# Patient Record
Sex: Male | Born: 1970 | Race: White | Hispanic: No | Marital: Married | Smoking: Never smoker
Health system: Southern US, Community
[De-identification: ages and names within clinical notes are randomized; demographics above are authoritative.]

## PROBLEM LIST (undated history)

## (undated) DIAGNOSIS — E039 Hypothyroidism, unspecified: Secondary | ICD-10-CM

## (undated) DIAGNOSIS — F411 Generalized anxiety disorder: Secondary | ICD-10-CM

## (undated) DIAGNOSIS — R1013 Epigastric pain: Secondary | ICD-10-CM

## (undated) DIAGNOSIS — N201 Calculus of ureter: Secondary | ICD-10-CM

## (undated) DIAGNOSIS — Q453 Other congenital malformations of pancreas and pancreatic duct: Secondary | ICD-10-CM

## (undated) DIAGNOSIS — K1379 Other lesions of oral mucosa: Secondary | ICD-10-CM

## (undated) DIAGNOSIS — D1803 Hemangioma of intra-abdominal structures: Secondary | ICD-10-CM

## (undated) DIAGNOSIS — N281 Cyst of kidney, acquired: Secondary | ICD-10-CM

## (undated) DIAGNOSIS — K760 Fatty (change of) liver, not elsewhere classified: Secondary | ICD-10-CM

## (undated) DIAGNOSIS — R740 Nonspecific elevation of levels of transaminase and lactic acid dehydrogenase [LDH]: Secondary | ICD-10-CM

## (undated) DIAGNOSIS — R7301 Impaired fasting glucose: Secondary | ICD-10-CM

## (undated) DIAGNOSIS — E038 Other specified hypothyroidism: Secondary | ICD-10-CM

## (undated) DIAGNOSIS — E781 Pure hyperglyceridemia: Secondary | ICD-10-CM

## (undated) DIAGNOSIS — N2889 Other specified disorders of kidney and ureter: Secondary | ICD-10-CM

## (undated) HISTORY — DX: Cyst of kidney, acquired: N28.1

## (undated) HISTORY — DX: Hemangioma of intra-abdominal structures: D18.03

## (undated) HISTORY — DX: Other congenital malformations of pancreas and pancreatic duct: Q45.3

## (undated) HISTORY — DX: Impaired fasting glucose: R73.01

## (undated) HISTORY — DX: Other specified disorders of kidney and ureter: N28.89

## (undated) HISTORY — DX: Hypothyroidism, unspecified: E03.9

## (undated) HISTORY — PX: ESOPHAGOGASTRODUODENOSCOPY: SHX1529

## (undated) HISTORY — DX: Other specified hypothyroidism: E03.8

## (undated) HISTORY — DX: Generalized anxiety disorder: F41.1

## (undated) HISTORY — DX: Fatty (change of) liver, not elsewhere classified: K76.0

## (undated) HISTORY — DX: Pure hyperglyceridemia: E78.1

## (undated) HISTORY — DX: Nonspecific elevation of levels of transaminase and lactic acid dehydrogenase (ldh): R74.0

## (undated) HISTORY — DX: Calculus of ureter: N20.1

## (undated) HISTORY — DX: Epigastric pain: R10.13

## (undated) HISTORY — DX: Other lesions of oral mucosa: K13.79

---

## 2008-11-03 ENCOUNTER — Encounter: Admission: RE | Admit: 2008-11-03 | Discharge: 2008-11-03 | Payer: Self-pay

## 2008-12-19 ENCOUNTER — Inpatient Hospital Stay (HOSPITAL_COMMUNITY): Admission: RE | Admit: 2008-12-19 | Discharge: 2008-12-20 | Payer: Self-pay | Admitting: Orthopedic Surgery

## 2009-02-23 ENCOUNTER — Inpatient Hospital Stay (HOSPITAL_COMMUNITY): Admission: RE | Admit: 2009-02-23 | Discharge: 2009-02-25 | Payer: Self-pay | Admitting: Orthopedic Surgery

## 2009-02-23 HISTORY — PX: TOTAL HIP ARTHROPLASTY: SHX124

## 2010-05-13 LAB — BASIC METABOLIC PANEL
BUN: 6 mg/dL (ref 6–23)
CO2: 27 mEq/L (ref 19–32)
Chloride: 101 mEq/L (ref 96–112)
GFR calc Af Amer: >60 mL/min (ref >60)
GFR calc non Af Amer: >60 mL/min (ref >60)
Glucose, Bld: 112 mg/dL — ABNORMAL HIGH (ref 70–99)
Sodium: 134 mEq/L — ABNORMAL LOW (ref 135–145)

## 2010-05-13 LAB — CBC
HCT: 33.7 % — ABNORMAL LOW (ref 39.0–52.0)
MCV: 96.4 fL (ref 78.0–100.0)
Platelets: 213 10*3/uL (ref 150–400)

## 2010-05-28 LAB — CBC
HCT: 42.8 % (ref 39.0–52.0)
Hemoglobin: 14.6 g/dL (ref 13.0–17.0)
MCHC: 35 g/dL (ref 30.0–36.0)
MCV: 96.1 fL (ref 78.0–100.0)
MCV: 96.7 fL (ref 78.0–100.0)
Platelets: 234 10*3/uL (ref 150–400)
Platelets: 252 10*3/uL (ref 150–400)
RBC: 4.45 MIL/uL (ref 4.22–5.81)
RDW: 12.5 % (ref 11.5–15.5)

## 2010-05-28 LAB — URINALYSIS, ROUTINE W REFLEX MICROSCOPIC
Bilirubin Urine: NEGATIVE
Glucose, UA: NEGATIVE mg/dL
Hgb urine dipstick: NEGATIVE
Nitrite: NEGATIVE
pH: 5 (ref 5.0–8.0)

## 2010-05-28 LAB — DIFFERENTIAL
Basophils Absolute: 0 10*3/uL (ref 0.0–0.1)
Basophils Relative: 1 % (ref 0–1)
Eosinophils Relative: 4 % (ref 0–5)
Lymphocytes Relative: 31 % (ref 12–46)
Monocytes Absolute: 0.3 10*3/uL (ref 0.1–1.0)
Neutrophils Relative %: 58 % (ref 43–77)

## 2010-05-28 LAB — BASIC METABOLIC PANEL
BUN: 7 mg/dL (ref 6–23)
CO2: 30 mEq/L (ref 19–32)
Chloride: 102 mEq/L (ref 96–112)
Creatinine, Ser: 0.82 mg/dL (ref 0.4–1.5)
GFR calc Af Amer: >60 mL/min (ref >60)
GFR calc non Af Amer: >60 mL/min (ref >60)
Glucose, Bld: 114 mg/dL — ABNORMAL HIGH (ref 70–99)
Sodium: 133 mEq/L — ABNORMAL LOW (ref 135–145)

## 2010-05-28 LAB — PROTIME-INR
INR: 0.85 (ref 0.00–1.49)
Prothrombin Time: 11.5 seconds — ABNORMAL LOW (ref 11.6–15.2)

## 2010-05-28 LAB — APTT: aPTT: 22 seconds — ABNORMAL LOW (ref 24–37)

## 2010-05-31 LAB — CBC
HCT: 44.1 % (ref 39.0–52.0)
Hemoglobin: 13.4 g/dL (ref 13.0–17.0)
Hemoglobin: 15.3 g/dL (ref 13.0–17.0)
MCHC: 34.5 g/dL (ref 30.0–36.0)
MCV: 95.4 fL (ref 78.0–100.0)
Platelets: 258 10*3/uL (ref 150–400)
RBC: 4.09 MIL/uL — ABNORMAL LOW (ref 4.22–5.81)
RBC: 4.62 MIL/uL (ref 4.22–5.81)
RDW: 13.2 % (ref 11.5–15.5)
WBC: 5.8 10*3/uL (ref 4.0–10.5)

## 2010-05-31 LAB — BASIC METABOLIC PANEL
BUN: 11 mg/dL (ref 6–23)
CO2: 30 mEq/L (ref 19–32)
Calcium: 8.8 mg/dL (ref 8.4–10.5)
Chloride: 102 mEq/L (ref 96–112)
Creatinine, Ser: 0.95 mg/dL (ref 0.4–1.5)
Glucose, Bld: 178 mg/dL — ABNORMAL HIGH (ref 70–99)
Sodium: 139 mEq/L (ref 135–145)

## 2010-05-31 LAB — TYPE AND SCREEN: ABO/RH(D): O POS

## 2010-05-31 LAB — GLUCOSE, CAPILLARY

## 2010-05-31 LAB — URINALYSIS, ROUTINE W REFLEX MICROSCOPIC
Hgb urine dipstick: NEGATIVE
Leukocytes, UA: NEGATIVE
Protein, ur: NEGATIVE mg/dL
Specific Gravity, Urine: 1.028 (ref 1.005–1.030)

## 2010-05-31 LAB — ABO/RH: ABO/RH(D): O POS

## 2010-05-31 LAB — PROTIME-INR
INR: 0.88 (ref 0.00–1.49)
Prothrombin Time: 11.9 seconds (ref 11.6–15.2)

## 2010-05-31 LAB — URINE MICROSCOPIC-ADD ON

## 2010-05-31 LAB — DIFFERENTIAL
Eosinophils Absolute: 0.1 10*3/uL (ref 0.0–0.7)
Eosinophils Relative: 1 % (ref 0–5)
Monocytes Absolute: 0.4 10*3/uL (ref 0.1–1.0)
Neutrophils Relative %: 56 % (ref 43–77)

## 2011-01-01 DIAGNOSIS — F32A Depression, unspecified: Secondary | ICD-10-CM | POA: Insufficient documentation

## 2011-01-01 DIAGNOSIS — F329 Major depressive disorder, single episode, unspecified: Secondary | ICD-10-CM | POA: Insufficient documentation

## 2011-07-16 DIAGNOSIS — F419 Anxiety disorder, unspecified: Secondary | ICD-10-CM | POA: Insufficient documentation

## 2012-10-26 DIAGNOSIS — N201 Calculus of ureter: Secondary | ICD-10-CM

## 2012-10-26 HISTORY — DX: Calculus of ureter: N20.1

## 2012-11-21 ENCOUNTER — Emergency Department (HOSPITAL_COMMUNITY): Payer: BC Managed Care – PPO

## 2012-11-21 ENCOUNTER — Encounter (HOSPITAL_COMMUNITY): Payer: Self-pay | Admitting: *Deleted

## 2012-11-21 ENCOUNTER — Emergency Department (HOSPITAL_COMMUNITY)
Admission: EM | Admit: 2012-11-21 | Discharge: 2012-11-21 | Disposition: A | Discharge status: Home / Self Care | Payer: BC Managed Care – PPO | Attending: Emergency Medicine | Admitting: Emergency Medicine

## 2012-11-21 DIAGNOSIS — Z79899 Other long term (current) drug therapy: Secondary | ICD-10-CM | POA: Insufficient documentation

## 2012-11-21 DIAGNOSIS — R112 Nausea with vomiting, unspecified: Secondary | ICD-10-CM | POA: Insufficient documentation

## 2012-11-21 DIAGNOSIS — R109 Unspecified abdominal pain: Secondary | ICD-10-CM | POA: Insufficient documentation

## 2012-11-21 DIAGNOSIS — M545 Low back pain, unspecified: Secondary | ICD-10-CM | POA: Insufficient documentation

## 2012-11-21 DIAGNOSIS — R6883 Chills (without fever): Secondary | ICD-10-CM | POA: Insufficient documentation

## 2012-11-21 DIAGNOSIS — N201 Calculus of ureter: Secondary | ICD-10-CM

## 2012-11-21 LAB — BASIC METABOLIC PANEL
BUN: 19 mg/dL (ref 6–23)
CO2: 23 mEq/L (ref 19–32)
Calcium: 9.5 mg/dL (ref 8.4–10.5)
GFR calc Af Amer: >90 mL/min (ref >90)
GFR calc non Af Amer: 87 mL/min — ABNORMAL LOW (ref >90)
Glucose, Bld: 158 mg/dL — ABNORMAL HIGH (ref 70–99)
Potassium: 3.8 mEq/L (ref 3.5–5.1)

## 2012-11-21 LAB — CBC WITH DIFFERENTIAL/PLATELET
Eosinophils Absolute: 0 10*3/uL (ref 0.0–0.7)
Eosinophils Relative: 0 % (ref 0–5)
HCT: 43.1 % (ref 39.0–52.0)
Hemoglobin: 15 g/dL (ref 13.0–17.0)
Lymphocytes Relative: 12 % (ref 12–46)
Lymphs Abs: 1.3 10*3/uL (ref 0.7–4.0)
MCH: 32.4 pg (ref 26.0–34.0)
MCHC: 34.8 g/dL (ref 30.0–36.0)
MCV: 93.1 fL (ref 78.0–100.0)
Monocytes Relative: 7 % (ref 3–12)
Neutrophils Relative %: 81 % — ABNORMAL HIGH (ref 43–77)
RBC: 4.63 MIL/uL (ref 4.22–5.81)
WBC: 10.7 10*3/uL — ABNORMAL HIGH (ref 4.0–10.5)

## 2012-11-21 LAB — URINE MICROSCOPIC-ADD ON

## 2012-11-21 LAB — URINALYSIS, ROUTINE W REFLEX MICROSCOPIC
Bilirubin Urine: NEGATIVE
Nitrite: NEGATIVE
Specific Gravity, Urine: 1.015 (ref 1.005–1.030)
pH: 5.5 (ref 5.0–8.0)

## 2012-11-21 MED ORDER — ONDANSETRON HCL 4 MG PO TABS: 4.0000 mg | ORAL_TABLET | Freq: Four times a day (QID) | ORAL | Status: DC

## 2012-11-21 MED ORDER — ONDANSETRON HCL 4 MG/2ML IJ SOLN
4.0000 mg | Freq: Once | INTRAMUSCULAR | Status: AC
  Administered 2012-11-21: 4 mg via INTRAVENOUS
  Filled 2012-11-21: qty 2

## 2012-11-21 MED ORDER — TAMSULOSIN HCL 0.4 MG PO CAPS: 0.4000 mg | ORAL_CAPSULE | Freq: Every day | ORAL | Status: DC

## 2012-11-21 MED ORDER — METOCLOPRAMIDE HCL 5 MG/ML IJ SOLN
10.0000 mg | Freq: Once | INTRAMUSCULAR | Status: AC
  Administered 2012-11-21: 10 mg via INTRAVENOUS
  Filled 2012-11-21: qty 2

## 2012-11-21 MED ORDER — HYDROMORPHONE HCL PF 1 MG/ML IJ SOLN
1.0000 mg | Freq: Once | INTRAMUSCULAR | Status: AC
  Administered 2012-11-21: 1 mg via INTRAVENOUS
  Filled 2012-11-21: qty 1

## 2012-11-21 MED ORDER — FENTANYL CITRATE 0.05 MG/ML IJ SOLN
25.0000 ug | Freq: Once | INTRAMUSCULAR | Status: AC
  Administered 2012-11-21: 25 ug via INTRAVENOUS
  Filled 2012-11-21: qty 2

## 2012-11-21 MED ORDER — HYDROCODONE-ACETAMINOPHEN 5-325 MG PO TABS: 1.0000 | ORAL_TABLET | ORAL | Status: DC | PRN

## 2012-11-21 MED ORDER — DIAZEPAM 5 MG/ML IJ SOLN
5.0000 mg | Freq: Once | INTRAMUSCULAR | Status: AC
  Administered 2012-11-21: 5 mg via INTRAVENOUS
  Filled 2012-11-21: qty 2

## 2012-11-21 NOTE — ED Provider Notes (Signed)
CSN: [id number]     Arrival date & time 11/21/12  0508 History   First MD Initiated Contact with Patient 11/21/12 0559     Chief Complaint  Patient presents with  . Testicle Pain   (Consider location/radiation/quality/duration/timing/severity/associated sxs/prior Treatment) HPI Comments: Patient presents with acute onset left testicular pain that woke him from sleep at 0100 today.  States "I feel like I got kicked in the nuts." Pain radiates into abdomen and low back pain described as mild and dull, associated nausea and vomiting, and chills.  States he was feeling well last night when he went to bed.  Denies fever, testicular swelling, dysuria, urinary frequency or urgency.  No hx abdominal surgeries.  No hx kidney stones or testicular problems or hernia. Pain 10/10 intensity.   The history is provided by the patient.    History reviewed. No pertinent past medical history. History reviewed. No pertinent past surgical history. No family history on file. History  Substance Use Topics  . Smoking status: Never Smoker   . Smokeless tobacco: Not on file  . Alcohol Use: Yes    Review of Systems  Constitutional: Positive for chills. Negative for fever.  Gastrointestinal: Positive for nausea, vomiting and abdominal pain.  Genitourinary: Positive for testicular pain. Negative for dysuria, urgency, frequency, penile swelling, scrotal swelling and penile pain.  Musculoskeletal: Positive for back pain.    Allergies  Review of patient's allergies indicates no known allergies.  Home Medications   Current Outpatient Rx  Name  Route  Sig  Dispense  Refill  . fish oil-omega-3 fatty acids 1000 MG capsule   Oral   Take 2 g by mouth daily.         Marland Kitchen NIACIN PO   Oral   Take 1 tablet by mouth daily.         . sertraline (ZOLOFT) 100 MG tablet   Oral   Take 100 mg by mouth daily.           BP 172/102  Pulse 101  Temp(Src) 97.3 F (36.3 C) (Oral)  Resp 20  SpO2 100% Physical  Exam  Nursing note and vitals reviewed. Constitutional: He appears well-developed and well-nourished. No distress.  HENT:  Head: Normocephalic and atraumatic.  Neck: Neck supple.  Pulmonary/Chest: Effort normal.  Abdominal: Soft. He exhibits no distension. There is no tenderness. There is no rebound, no guarding and no CVA tenderness.  Genitourinary: Penis normal. Right testis shows no mass, no swelling and no tenderness. Left testis shows tenderness. Left testis shows no mass and no swelling. Circumcised.  No erythema, edema, warmth, or discoloration of scrotum.   Neurological: He is alert.  Skin: He is not diaphoretic.    ED Course  Procedures (including critical care time) Labs Review Labs Reviewed  URINALYSIS, ROUTINE W REFLEX MICROSCOPIC - Abnormal; Notable for the following:    Glucose, UA 100 (*)    Hgb urine dipstick TRACE (*)    All other components within normal limits  CBC WITH DIFFERENTIAL - Abnormal; Notable for the following:    WBC 10.7 (*)    Neutrophils Relative % 81 (*)    Neutro Abs 8.7 (*)    All other components within normal limits  BASIC METABOLIC PANEL - Abnormal; Notable for the following:    Glucose, Bld 158 (*)    GFR calc non Af Amer 87 (*)    All other components within normal limits  URINE MICROSCOPIC-ADD ON   Imaging Review Ct Abdomen Pelvis  Wo Contrast  11/21/2012   CLINICAL DATA:  Left groin pain. Nausea and vomiting. Urolithiasis.  EXAM: CT ABDOMEN AND PELVIS WITHOUT CONTRAST  TECHNIQUE: Multidetector CT imaging of the abdomen and pelvis was performed following the standard protocol without intravenous contrast.  COMPARISON:  None.  FINDINGS: Mild left hydronephrosis and ureterectasis seen as well as mild left perinephric stranding. There is a 2 mm calculus in the urinary bladder near the left ureterovesical junction, which may have already passed into the bladder. No other ureteral calculi identified. Several other tiny 1 mm nonobstructive calculi  noted in the right kidney. Right hip prosthesis causes beam hardening artifact in the inferior aspect of the bladder and pelvis.  The other abdominal parenchymal organs have a normal appearance on this noncontrast study. Gallbladder is unremarkable. No soft tissue masses or lymphadenopathy identified. No evidence of dilated bowel loops.  IMPRESSION: Mild left hydroureteronephrosis with perinephric stranding due to 2 mm calculus near the left ureterovesical junction, which may have already passed into the bladder.  Tiny nonobstructive right nephrolithiasis.   Electronically Signed   By: Myles Rosenthal   On: 11/21/2012 09:04   US Scrotum  11/21/2012   CLINICAL DATA:  Left testicular pain  EXAM: SCROTAL ULTRASOUND  DOPPLER ULTRASOUND OF THE TESTICLES  TECHNIQUE: Complete ultrasound examination of the testicles, epididymis, and other scrotal structures was performed. Color and spectral Doppler ultrasound were also utilized to evaluate blood flow to the testicles.  COMPARISON:  None.  FINDINGS: Right testis: Normal in size and appearance, measuring 4.0 x 2.4 x 2.7 cm. Normal color Doppler flow.  Left testis: Normal in size and appearance, measuring 4.6 x 2.4 x 2.7 cm. Normal color Doppler flow.  Right epididymis:  Normal in size and appearance.  Left epididymis:  Normal in size and appearance.  Hydrocele:  Small hydroceles bilaterally.  Varicocele:  Absent.  Pulsed Doppler interrogation of both testes demonstrates low resistance arterial and venous waveforms bilaterally.  Additional comments: Mild scrotal wall thickening/ edema.  IMPRESSION: Normal sonographic appearance of the bilateral testes.  No evidence of testicular torsion.  Mild scrotal wall thickening/edema, nonspecific.   Electronically Signed   By: Charline Bills M.D.   On: 11/21/2012 08:21   [place] Art/ven Flow Abd Pelv Doppler  11/21/2012   CLINICAL DATA:  Left testicular pain  EXAM: SCROTAL ULTRASOUND  DOPPLER ULTRASOUND OF THE TESTICLES  TECHNIQUE:  Complete ultrasound examination of the testicles, epididymis, and other scrotal structures was performed. Color and spectral Doppler ultrasound were also utilized to evaluate blood flow to the testicles.  COMPARISON:  None.  FINDINGS: Right testis: Normal in size and appearance, measuring 4.0 x 2.4 x 2.7 cm. Normal color Doppler flow.  Left testis: Normal in size and appearance, measuring 4.6 x 2.4 x 2.7 cm. Normal color Doppler flow.  Right epididymis:  Normal in size and appearance.  Left epididymis:  Normal in size and appearance.  Hydrocele:  Small hydroceles bilaterally.  Varicocele:  Absent.  Pulsed Doppler interrogation of both testes demonstrates low resistance arterial and venous waveforms bilaterally.  Additional comments: Mild scrotal wall thickening/ edema.  IMPRESSION: Normal sonographic appearance of the bilateral testes.  No evidence of testicular torsion.  Mild scrotal wall thickening/edema, nonspecific.   Electronically Signed   By: Charline Bills M.D.   On: 11/21/2012 08:21    6:06 AM Dr Silverio Lay made aware of the patient. [place], UA, pain and nausea medications ordered.  Patient and spouse made aware of findings and plans for  workup.  DDx includes testicular torsion, ureteral stone.  7:53 AM Multiple rechecks on patient for persistent pain, nausea, and dizziness after pain medication.  Pain is still 10/10.  Will given valium for dizziness and nausea, pain as pain is untouched by narcotics.  Pt has been to ultrasound and returned, awaiting results.    MDM   1. Ureteral stone    Patient with left testicular pain, acute onset, found to have left ureteral stone 2mm.  Pt r/o for testicular torsion with [place].  Pain and nausea treated in ED. D/C home with pain, nausea meds and flomax, urology follow up.  Discussed results, findings, treatment, and follow up  with patient.  Pt given return precautions.  Pt verbalizes understanding and agrees with plan.        Trixie Dredge, PA-C 11/21/12 1018

## 2012-11-21 NOTE — ED Notes (Signed)
Patient transported to Ultrasound 

## 2012-11-21 NOTE — ED Notes (Signed)
Testicle pain sudden onset 0100 nauseated.  No difficulty voiding

## 2012-11-21 NOTE — ED Provider Notes (Signed)
Medical screening examination/treatment/procedure(s) were performed by non-physician practitioner and as supervising physician I was immediately available for consultation/collaboration.   Richardean Canal, MD 11/21/12 2251

## 2012-11-21 NOTE — ED Notes (Signed)
Pt states he woke up at 0100 this morning from sleeping with severe left testicular pain, pt states he also has lower abdominal pain.

## 2012-11-21 NOTE — ED Notes (Signed)
Patient transported to CT 

## 2012-11-21 NOTE — ED Notes (Signed)
Returned from U/S

## 2015-05-25 ENCOUNTER — Ambulatory Visit: Payer: Self-pay | Admitting: Family Medicine

## 2015-05-26 ENCOUNTER — Ambulatory Visit: Payer: Self-pay | Admitting: Family Medicine

## 2015-06-02 ENCOUNTER — Ambulatory Visit: Payer: Self-pay | Admitting: Family Medicine

## 2015-06-02 ENCOUNTER — Encounter: Payer: Self-pay | Admitting: Family Medicine

## 2015-06-02 DIAGNOSIS — Z0289 Encounter for other administrative examinations: Secondary | ICD-10-CM

## 2015-06-26 DIAGNOSIS — R7401 Elevation of levels of liver transaminase levels: Secondary | ICD-10-CM | POA: Insufficient documentation

## 2015-06-26 DIAGNOSIS — R740 Nonspecific elevation of levels of transaminase and lactic acid dehydrogenase [LDH]: Secondary | ICD-10-CM

## 2015-06-26 HISTORY — DX: Elevation of levels of liver transaminase levels: R74.01

## 2015-06-30 ENCOUNTER — Ambulatory Visit (INDEPENDENT_AMBULATORY_CARE_PROVIDER_SITE_OTHER): Payer: BLUE CROSS/BLUE SHIELD | Admitting: Family Medicine

## 2015-06-30 ENCOUNTER — Encounter: Payer: Self-pay | Admitting: Family Medicine

## 2015-06-30 VITALS — BP 144/91 | HR 87 | Temp 98.5°F | Ht 69.0 in | Wt 186.1 lb

## 2015-06-30 DIAGNOSIS — Z0000 Encounter for general adult medical examination without abnormal findings: Secondary | ICD-10-CM | POA: Diagnosis not present

## 2015-06-30 MED ORDER — SERTRALINE HCL 100 MG PO TABS: 100.0000 mg | ORAL_TABLET | Freq: Every day | ORAL | Status: DC

## 2015-06-30 NOTE — Progress Notes (Signed)
Office Note 07/02/2015  CC:  Chief Complaint  Patient presents with  . New Patient (Initial Visit)    establish care    HPI:  Andrew Guzman is a 45 y.o. White male who is here to establish care. Patient's most recent primary MD: [facility] in [place]. Old records from EMR were reviewed prior to or during today's visit.  Most recent CPE with labs was approx 2014 prior to him getting his kidney stone. No acute complaints.  Past Medical History  Diagnosis Date  . Ureterolithiasis 10/2012  . Recurrent oral ulcers   . 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 GAD (generalized anxiety disorder)     zoloft helpful, esp for recurrent epigastric pains  . Dyspepsia     Past Surgical History  Procedure Laterality Date  . Total hip arthroplasty Right 02/23/2009    for avascular necrosis (Dr. Alvan Dame)  . Esophagogastroduodenoscopy      dyspepsia/?PUD    Family History  Problem Relation Age of Onset  . Heart Problems Father   . Heart attack Paternal Grandfather     Social History   Social History  . Marital Status: Married    Spouse Name: Amy  . Number of Children: 4  . Years of Education: Assoc   Occupational History  .  Other    [employer]   Social History Main Topics  . Smoking status: Never Smoker   . Smokeless tobacco: Never Used  . Alcohol Use: 0.0 oz/week    0 Standard drinks or equivalent per week     Comment: 2 beers a night  . Drug Use: No  . Sexual Activity: Not on file   Other Topics Concern  . Not on file   Social History Narrative   Married, 4 kids, one lives with him.   Educ: Associates degree   OccupTheatre manager for an Geneticist, molecular   No T/A/D's.   Caffeine:1-2 cups daily    Outpatient Encounter Prescriptions as of 06/30/2015  Medication Sig  . sertraline (ZOLOFT) 100 MG tablet Take 1 tablet (100 mg total) by mouth daily.  . [DISCONTINUED] sertraline (ZOLOFT) 100 MG tablet Take 100 mg by mouth daily.   .  [DISCONTINUED] fish oil-omega-3 fatty acids 1000 MG capsule Take 2 g by mouth daily.  . [DISCONTINUED] HYDROcodone-acetaminophen (NORCO/VICODIN) 5-325 MG per tablet Take 1-2 tablets by mouth every 4 (four) hours as needed for pain.  . [DISCONTINUED] NIACIN PO Take 1 tablet by mouth daily.  . [DISCONTINUED] ondansetron (ZOFRAN) 4 MG tablet Take 1 tablet (4 mg total) by mouth every 6 (six) hours.  . [DISCONTINUED] tamsulosin (FLOMAX) 0.4 MG CAPS capsule Take 1 capsule (0.4 mg total) by mouth daily.   No facility-administered encounter medications on file as of 06/30/2015.    No Known Allergies  ROS Review of Systems  Constitutional: Negative for fever, chills, appetite change and fatigue.  HENT: Negative for congestion, dental problem, ear pain and sore throat.   Eyes: Negative for discharge, redness and visual disturbance.  Respiratory: Negative for cough, chest tightness, shortness of breath and wheezing.   Cardiovascular: Negative for chest pain, palpitations and leg swelling.  Gastrointestinal: Negative for nausea, vomiting, abdominal pain, diarrhea and blood in stool.  Genitourinary: Negative for dysuria, urgency, frequency, hematuria, flank pain and difficulty urinating.  Musculoskeletal: Negative for myalgias, back pain, joint swelling, arthralgias and neck stiffness.  Skin: Negative for pallor and rash.  Neurological: Negative for dizziness, speech  difficulty, weakness and headaches.  Hematological: Negative for adenopathy. Does not bruise/bleed easily.  Psychiatric/Behavioral: Negative for confusion and sleep disturbance. The patient is not nervous/anxious.     PE; Blood pressure 144/91, pulse 87, temperature 98.5 F (36.9 C), temperature source Oral, height 5\' 9"  (1.753 m), weight 186 lb 1.9 oz (84.423 kg), SpO2 96 %. Gen: Alert, well appearing.  Patient is oriented to person, place, time, and situation. AFFECT: pleasant, lucid thought and speech. ENT: Ears: EACs clear, normal  epithelium.  TMs with good light reflex and landmarks bilaterally.  Eyes: no injection, icteris, swelling, or exudate.  EOMI, PERRLA. Nose: no drainage or turbinate edema/swelling.  No injection or focal lesion.  Mouth: lips without lesion/swelling.  Oral mucosa pink and moist.  Dentition intact and without obvious caries or gingival swelling.  Oropharynx without erythema, exudate, or swelling.  Neck: supple/nontender.  No LAD, mass, or TM.  Carotid pulses 2+ bilaterally, without bruits. CV: RRR, no m/r/g.   LUNGS: CTA bilat, nonlabored resps, good aeration in all lung fields. ABD: soft, NT, ND, BS normal.  No hepatospenomegaly or mass.  No bruits. EXT: no clubbing, cyanosis, or edema.  Musculoskeletal: no joint swelling, erythema, warmth, or tenderness.  ROM of all joints intact. Skin - no sores or suspicious lesions or rashes or color changes   Pertinent labs:  none  ASSESSMENT AND PLAN:   New pt.  Health maintenance exam: Reviewed age and gender appropriate health maintenance issues (prudent diet, regular exercise, health risks of tobacco and excessive alcohol, use of seatbelts, fire alarms in home, use of sunscreen).  Also reviewed age and gender appropriate health screening as well as vaccine recommendations. No Vaccines needed (pt reports last Tdap was 2014). He'll return to lab when fasting to get HP labs drawn. I refilled his sertraline today.  An After Visit Summary was printed and given to the patient.  Return in about 1 year (around 06/29/2016) for annual CPE (fasting).  Signed:  Crissie Sickles, MD           07/02/2015

## 2015-06-30 NOTE — Progress Notes (Signed)
Pre visit review using our clinic review tool, if applicable. No additional management support is needed unless otherwise documented below in the visit note. 

## 2015-07-02 ENCOUNTER — Encounter: Payer: Self-pay | Admitting: Family Medicine

## 2015-07-03 ENCOUNTER — Other Ambulatory Visit (INDEPENDENT_AMBULATORY_CARE_PROVIDER_SITE_OTHER): Payer: BLUE CROSS/BLUE SHIELD

## 2015-07-03 DIAGNOSIS — R7989 Other specified abnormal findings of blood chemistry: Secondary | ICD-10-CM | POA: Diagnosis not present

## 2015-07-03 DIAGNOSIS — Z0000 Encounter for general adult medical examination without abnormal findings: Secondary | ICD-10-CM | POA: Diagnosis not present

## 2015-07-03 DIAGNOSIS — R946 Abnormal results of thyroid function studies: Secondary | ICD-10-CM

## 2015-07-03 LAB — COMPREHENSIVE METABOLIC PANEL
ALBUMIN: 4.2 g/dL (ref 3.5–5.2)
ALT: 76 U/L — ABNORMAL HIGH (ref 0–53)
AST: 98 U/L — ABNORMAL HIGH (ref 0–37)
Alkaline Phosphatase: 125 U/L — ABNORMAL HIGH (ref 39–117)
BILIRUBIN TOTAL: 0.9 mg/dL (ref 0.2–1.2)
BUN: 12 mg/dL (ref 6–23)
CO2: 26 mEq/L (ref 19–32)
Calcium: 9.3 mg/dL (ref 8.4–10.5)
Chloride: 99 mEq/L (ref 96–112)
Creatinine, Ser: 0.84 mg/dL (ref 0.40–1.50)
GFR: 104.46 mL/min (ref >60.00)
GLUCOSE: 124 mg/dL — AB (ref 70–99)
Potassium: 3.7 mEq/L (ref 3.5–5.1)
SODIUM: 137 meq/L (ref 135–145)
TOTAL PROTEIN: 7.3 g/dL (ref 6.0–8.3)

## 2015-07-03 LAB — LIPID PANEL
CHOL/HDL RATIO: 4
Cholesterol: 265 mg/dL — ABNORMAL HIGH (ref 0–200)
HDL: 59.3 mg/dL (ref >39.00)
Triglycerides: 801 mg/dL — ABNORMAL HIGH (ref 0.0–149.0)

## 2015-07-03 LAB — CBC WITH DIFFERENTIAL/PLATELET
BASOS PCT: 1 % (ref 0.0–3.0)
Basophils Absolute: 0.1 10*3/uL (ref 0.0–0.1)
EOS PCT: 4.1 % (ref 0.0–5.0)
Eosinophils Absolute: 0.3 10*3/uL (ref 0.0–0.7)
HCT: 43.5 % (ref 39.0–52.0)
Hemoglobin: 14.9 g/dL (ref 13.0–17.0)
LYMPHS ABS: 2 10*3/uL (ref 0.7–4.0)
Lymphocytes Relative: 31.4 % (ref 12.0–46.0)
MCHC: 34.3 g/dL (ref 30.0–36.0)
MCV: 98.3 fl (ref 78.0–100.0)
MONOS PCT: 8 % (ref 3.0–12.0)
Monocytes Absolute: 0.5 10*3/uL (ref 0.1–1.0)
Neutro Abs: 3.5 10*3/uL (ref 1.4–7.7)
Neutrophils Relative %: 55.5 % (ref 43.0–77.0)
Platelets: 275 10*3/uL (ref 150.0–400.0)
RBC: 4.43 Mil/uL (ref 4.22–5.81)
RDW: 13.8 % (ref 11.5–15.5)
WBC: 6.3 10*3/uL (ref 4.0–10.5)

## 2015-07-03 LAB — T4, FREE: Free T4: 0.62 ng/dL (ref 0.60–1.60)

## 2015-07-03 LAB — TSH: TSH: 8.05 u[IU]/mL — ABNORMAL HIGH (ref 0.35–4.50)

## 2015-07-03 LAB — LDL CHOLESTEROL, DIRECT: Direct LDL: 104 mg/dL

## 2015-07-03 LAB — T3, FREE: T3, Free: 3.5 pg/mL (ref 2.3–4.2)

## 2015-07-07 ENCOUNTER — Other Ambulatory Visit: Payer: Self-pay | Admitting: Family Medicine

## 2015-07-07 DIAGNOSIS — R7301 Impaired fasting glucose: Secondary | ICD-10-CM

## 2015-07-07 DIAGNOSIS — R739 Hyperglycemia, unspecified: Secondary | ICD-10-CM

## 2015-07-07 MED ORDER — FENOFIBRATE 145 MG PO TABS: 145.0000 mg | ORAL_TABLET | Freq: Every day | ORAL | Status: DC

## 2015-08-09 ENCOUNTER — Ambulatory Visit (INDEPENDENT_AMBULATORY_CARE_PROVIDER_SITE_OTHER): Payer: BLUE CROSS/BLUE SHIELD | Admitting: Family Medicine

## 2015-08-09 ENCOUNTER — Ambulatory Visit (HOSPITAL_BASED_OUTPATIENT_CLINIC_OR_DEPARTMENT_OTHER)
Admission: RE | Admit: 2015-08-09 | Discharge: 2015-08-09 | Disposition: A | Discharge status: Home / Self Care | Payer: BLUE CROSS/BLUE SHIELD | Source: Ambulatory Visit | Attending: Family Medicine | Admitting: Family Medicine

## 2015-08-09 ENCOUNTER — Encounter: Payer: Self-pay | Admitting: Family Medicine

## 2015-08-09 VITALS — BP 143/93 | HR 101 | Temp 98.5°F | Resp 16 | Ht 69.0 in | Wt 180.2 lb

## 2015-08-09 DIAGNOSIS — M79605 Pain in left leg: Secondary | ICD-10-CM | POA: Diagnosis not present

## 2015-08-09 DIAGNOSIS — M79662 Pain in left lower leg: Secondary | ICD-10-CM

## 2015-08-09 DIAGNOSIS — R208 Other disturbances of skin sensation: Secondary | ICD-10-CM

## 2015-08-09 NOTE — Progress Notes (Signed)
OFFICE VISIT  08/09/2015   CC:  Chief Complaint  Patient presents with  . Numbness    left leg x 4 weeks     HPI:    Patient is a 45 y.o. Caucasian male who presents for about 3-4 weeks history of intermittent left leg discomfort that he finds hard to describe.  Location is usually most prominent in foot and lower leg up to knee, but he also feels some in upper leg up to L hip region.  He deals with this constantly over the last 2 weeks but some days are worse than others.  No similar sensation in R leg.  No injury to left leg.  No signif weakness.  No tingling.  No pain in back radiating down leg.  He feels like it is coming from his foot up.  He hesitates to use the words pain or numbness. Hx of THA on R for avascular necrosis.    Past Medical History  Diagnosis Date  . Ureterolithiasis 10/2012  . Recurrent oral ulcers   . 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 GAD (generalized anxiety disorder)     zoloft helpful, esp for recurrent epigastric pains  . Dyspepsia     Past Surgical History  Procedure Laterality Date  . Total hip arthroplasty Right 02/23/2009    for avascular necrosis (Dr. Alvan Dame)  . Esophagogastroduodenoscopy      dyspepsia/?PUD    Outpatient Prescriptions Prior to Visit  Medication Sig Dispense Refill  . fenofibrate (TRICOR) 145 MG tablet Take 1 tablet (145 mg total) by mouth daily. 30 tablet 2  . sertraline (ZOLOFT) 100 MG tablet Take 1 tablet (100 mg total) by mouth daily. 90 tablet 3   No facility-administered medications prior to visit.    No Known Allergies  ROS As per HPI  PE: Blood pressure 143/93, pulse 101, temperature 98.5 F (36.9 C), temperature source Oral, resp. rate 16, height 5\' 9"  (1.753 m), weight 180 lb 4 oz (81.761 kg), SpO2 94 %. Gen: Alert, well appearing.  Patient is oriented to person, place, time, and situation. LEGS: strength 5/5 prox/dist. Left hip, knee, and ankle ROM all fully intact w/out pain or  stiffness. No calf tenderness or cord palpable.  No LL edema or discoloration. DP and PT pulse 1+ bilat.   Sensation intact to light touch and pin prick bilat in legs prox and dist.  LABS:  none  IMPRESSION AND PLAN:  Left leg vague discomfort; unknown etiology. No sign of low back, hip, or neurologic pathology. Exam normal/reassuring. Given the focus of his symptom in lower leg I feel like it is prudent to r/o DVT atypical presentation. Will get venous doppler u/s L leg. If this is normal and symptoms continue or worsen, will likely ask neuro to see him vs x-ray L hip b/c he is worried about this hip given the hx of avascular necrosis in R hip.  An After Visit Summary was printed and given to the patient.  FOLLOW UP: Return if symptoms worsen or fail to improve.  Signed:  Crissie Sickles, MD           08/09/2015

## 2015-08-09 NOTE — Progress Notes (Signed)
Pre visit review using our clinic review tool, if applicable. No additional management support is needed unless otherwise documented below in the visit note. 

## 2015-09-09 ENCOUNTER — Other Ambulatory Visit: Payer: Self-pay | Admitting: Family Medicine

## 2015-09-11 NOTE — Telephone Encounter (Signed)
RF request for fenofibrate LOV: 06/30/15 Next ov: 07/01/16 Last written: 07/07/15 #30 w/ 2RF

## 2015-09-26 DIAGNOSIS — N2889 Other specified disorders of kidney and ureter: Secondary | ICD-10-CM

## 2015-09-26 DIAGNOSIS — N281 Cyst of kidney, acquired: Secondary | ICD-10-CM

## 2015-09-26 DIAGNOSIS — D1803 Hemangioma of intra-abdominal structures: Secondary | ICD-10-CM

## 2015-09-26 HISTORY — DX: Other specified disorders of kidney and ureter: N28.89

## 2015-09-26 HISTORY — DX: Hemangioma of intra-abdominal structures: D18.03

## 2015-09-26 HISTORY — DX: Cyst of kidney, acquired: N28.1

## 2015-10-01 DIAGNOSIS — K7689 Other specified diseases of liver: Secondary | ICD-10-CM | POA: Diagnosis not present

## 2015-10-01 DIAGNOSIS — Z87891 Personal history of nicotine dependence: Secondary | ICD-10-CM | POA: Diagnosis not present

## 2015-10-01 DIAGNOSIS — K219 Gastro-esophageal reflux disease without esophagitis: Secondary | ICD-10-CM | POA: Diagnosis not present

## 2015-10-01 DIAGNOSIS — S301XXA Contusion of abdominal wall, initial encounter: Secondary | ICD-10-CM | POA: Diagnosis not present

## 2015-10-01 DIAGNOSIS — K769 Liver disease, unspecified: Secondary | ICD-10-CM | POA: Diagnosis not present

## 2015-10-01 DIAGNOSIS — N200 Calculus of kidney: Secondary | ICD-10-CM | POA: Diagnosis not present

## 2015-10-01 DIAGNOSIS — Z79891 Long term (current) use of opiate analgesic: Secondary | ICD-10-CM | POA: Diagnosis not present

## 2015-10-01 DIAGNOSIS — R938 Abnormal findings on diagnostic imaging of other specified body structures: Secondary | ICD-10-CM | POA: Diagnosis not present

## 2015-10-01 DIAGNOSIS — Z79899 Other long term (current) drug therapy: Secondary | ICD-10-CM | POA: Diagnosis not present

## 2015-10-01 DIAGNOSIS — N281 Cyst of kidney, acquired: Secondary | ICD-10-CM | POA: Diagnosis not present

## 2015-10-01 DIAGNOSIS — S29009A Unspecified injury of muscle and tendon of unspecified wall of thorax, initial encounter: Secondary | ICD-10-CM | POA: Diagnosis not present

## 2015-10-01 DIAGNOSIS — R1032 Left lower quadrant pain: Secondary | ICD-10-CM | POA: Diagnosis not present

## 2015-10-01 DIAGNOSIS — F418 Other specified anxiety disorders: Secondary | ICD-10-CM | POA: Diagnosis not present

## 2015-10-01 DIAGNOSIS — R109 Unspecified abdominal pain: Secondary | ICD-10-CM | POA: Diagnosis not present

## 2015-10-02 ENCOUNTER — Telehealth: Payer: Self-pay | Admitting: Family Medicine

## 2015-10-02 DIAGNOSIS — K769 Liver disease, unspecified: Secondary | ICD-10-CM

## 2015-10-02 DIAGNOSIS — N281 Cyst of kidney, acquired: Secondary | ICD-10-CM

## 2015-10-02 NOTE — Telephone Encounter (Signed)
Please advise 

## 2015-10-02 NOTE — Telephone Encounter (Signed)
Was seen at [facility] ER yesterday and told he had a spot on his liver and a cyst on his kidney based on a CT scan. There are supposed to be faxing his discharge summary but suggested he contact his PCP to be set up for an U/S. He is aware that Dr Anitra Lauth isout of the office but would like to know if the referral can be made in his absence as he is very concerned.

## 2015-10-02 NOTE — Telephone Encounter (Signed)
Pt was called and advised that he needed an appointment. The first available appt was 10/16/15 fore patient. He would like Dr Anitra Lauth to consider ordering the u/s in the meantime so the results can be available at his appt on 8/21.

## 2015-10-02 NOTE — Telephone Encounter (Signed)
I am happy to call the patient back. I just need the ok for the patient to wait until Dr Isla Pence return. He does not have a 30 min opening until the 16th.

## 2015-10-02 NOTE — Telephone Encounter (Signed)
Per Dr Raoul Pitch this requires an appt with his PCP for evaluation before any referral can be placed he will have to schedule an appt with Dr Anitra Lauth.

## 2015-10-02 NOTE — Telephone Encounter (Signed)
Per Dr Raoul Pitch ok for patient to be scheduled at next avialable 30 minute appt  With Dr Anitra Lauth

## 2015-10-03 NOTE — Telephone Encounter (Signed)
I'll order the ultrasound.  Pls find out what imaging location the patient wants to go to.-thx

## 2015-10-04 NOTE — Telephone Encounter (Signed)
LMOM for pt to CB to let us know where to order U/S

## 2015-10-05 NOTE — Telephone Encounter (Signed)
Patient prefers [facility].

## 2015-10-05 NOTE — Telephone Encounter (Signed)
Patient returned my call but LM with [place].  Tried to [facility] and [facility] for pt to talk with whoever answers the phone about where he would like his U/S ordered.

## 2015-10-05 NOTE — Telephone Encounter (Signed)
Pt advised and voiced understanding.   

## 2015-10-05 NOTE — Telephone Encounter (Signed)
OK, ultrasound has been ordered.

## 2015-10-09 ENCOUNTER — Other Ambulatory Visit: Payer: Self-pay | Admitting: Family Medicine

## 2015-10-09 ENCOUNTER — Ambulatory Visit (HOSPITAL_BASED_OUTPATIENT_CLINIC_OR_DEPARTMENT_OTHER)
Admission: RE | Admit: 2015-10-09 | Discharge: 2015-10-09 | Disposition: A | Discharge status: Home / Self Care | Payer: BLUE CROSS/BLUE SHIELD | Source: Ambulatory Visit | Attending: Family Medicine | Admitting: Family Medicine

## 2015-10-09 DIAGNOSIS — Q6100 Congenital renal cyst, unspecified: Secondary | ICD-10-CM | POA: Diagnosis present

## 2015-10-09 DIAGNOSIS — R932 Abnormal findings on diagnostic imaging of liver and biliary tract: Secondary | ICD-10-CM | POA: Diagnosis not present

## 2015-10-09 DIAGNOSIS — R93422 Abnormal radiologic findings on diagnostic imaging of left kidney: Secondary | ICD-10-CM | POA: Diagnosis not present

## 2015-10-09 DIAGNOSIS — K769 Liver disease, unspecified: Secondary | ICD-10-CM

## 2015-10-09 DIAGNOSIS — K7689 Other specified diseases of liver: Secondary | ICD-10-CM | POA: Diagnosis not present

## 2015-10-09 DIAGNOSIS — N281 Cyst of kidney, acquired: Secondary | ICD-10-CM

## 2015-10-09 DIAGNOSIS — N289 Disorder of kidney and ureter, unspecified: Secondary | ICD-10-CM

## 2015-10-14 ENCOUNTER — Ambulatory Visit (HOSPITAL_BASED_OUTPATIENT_CLINIC_OR_DEPARTMENT_OTHER)
Admission: RE | Admit: 2015-10-14 | Discharge: 2015-10-14 | Disposition: A | Discharge status: Home / Self Care | Payer: BLUE CROSS/BLUE SHIELD | Source: Ambulatory Visit | Attending: Family Medicine | Admitting: Family Medicine

## 2015-10-14 DIAGNOSIS — K769 Liver disease, unspecified: Secondary | ICD-10-CM

## 2015-10-14 DIAGNOSIS — N281 Cyst of kidney, acquired: Secondary | ICD-10-CM | POA: Diagnosis not present

## 2015-10-14 DIAGNOSIS — N289 Disorder of kidney and ureter, unspecified: Secondary | ICD-10-CM

## 2015-10-14 DIAGNOSIS — K7689 Other specified diseases of liver: Secondary | ICD-10-CM | POA: Insufficient documentation

## 2015-10-14 MED ORDER — GADOBENATE DIMEGLUMINE 529 MG/ML IV SOLN
17.0000 mL | Freq: Once | INTRAVENOUS | Status: AC | PRN
  Administered 2015-10-14: 17 mL via INTRAVENOUS

## 2015-10-15 ENCOUNTER — Encounter: Payer: Self-pay | Admitting: Family Medicine

## 2015-10-16 ENCOUNTER — Encounter: Payer: Self-pay | Admitting: Family Medicine

## 2015-10-16 ENCOUNTER — Ambulatory Visit (INDEPENDENT_AMBULATORY_CARE_PROVIDER_SITE_OTHER): Payer: BLUE CROSS/BLUE SHIELD | Admitting: Family Medicine

## 2015-10-16 VITALS — BP 144/90 | HR 87 | Temp 98.7°F | Resp 16 | Ht 69.0 in | Wt 180.0 lb

## 2015-10-16 DIAGNOSIS — R030 Elevated blood-pressure reading, without diagnosis of hypertension: Secondary | ICD-10-CM

## 2015-10-16 DIAGNOSIS — S301XXD Contusion of abdominal wall, subsequent encounter: Secondary | ICD-10-CM | POA: Diagnosis not present

## 2015-10-16 DIAGNOSIS — Q6100 Congenital renal cyst, unspecified: Secondary | ICD-10-CM | POA: Diagnosis not present

## 2015-10-16 DIAGNOSIS — N281 Cyst of kidney, acquired: Secondary | ICD-10-CM

## 2015-10-16 DIAGNOSIS — R740 Nonspecific elevation of levels of transaminase and lactic acid dehydrogenase [LDH]: Secondary | ICD-10-CM

## 2015-10-16 DIAGNOSIS — R7401 Elevation of levels of liver transaminase levels: Secondary | ICD-10-CM

## 2015-10-16 DIAGNOSIS — D1803 Hemangioma of intra-abdominal structures: Secondary | ICD-10-CM | POA: Diagnosis not present

## 2015-10-16 NOTE — Progress Notes (Signed)
OFFICE VISIT  10/16/2015   CC:  Chief Complaint  Patient presents with  . Hospitalization Follow-up   HPI:    Patient is a 45 y.o. Caucasian male who presents accompanied by his wife and son for f/u abdominal wall contusion sustained when wrestling with his son. This caused a lot of L side pain and pt thought he had ruptured his spleen.  He therefore went to ED [facility] in [place]) on 10/02/15 and a CT abd showed no acute problem, but it did pick up an incidental liver lesion as well as a left kidney lesion of unclear etiology on CT imaging.   Since that ED visit, I arranged f/u imaging to further evaluate his liver and kidney lesions. U/S was not definitive, so he eventually got an MRI abd w and w/o contrast.  This showed a small liver hemangioma and a small simple (left) renal cyst, neither of which needs f/u imaging. He is relieved to hear this good news today.  He says his left side pain is gradually getting better.    BP up here today.  Pt says it was "ok" when he was in the ED recently.  His wife says it is always mildly elevated when checked at a pharmacy.  He has never been on bp med in the past.  ROS: no HAs, no n/v, no palpitations, no CP, no SOB, LE swelling  Past Medical History:  Diagnosis Date  . Dyspepsia   . GAD (generalized anxiety disorder)    zoloft helpful, esp for recurrent epigastric pains  . 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  No f/u imaging needed.  . 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 Recurrent oral ulcers   . 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 Ureterolithiasis 10/2012    Past Surgical History:  Procedure Laterality Date  . ESOPHAGOGASTRODUODENOSCOPY     dyspepsia/?PUD  . TOTAL HIP ARTHROPLASTY Right 02/23/2009   for avascular necrosis (Dr. Alvan Dame)    Outpatient Medications Prior to Visit  Medication Sig Dispense Refill  . fenofibrate (TRICOR) 145 MG tablet Take 1 tablet (145 mg  total) by mouth daily. 30 tablet 6  . sertraline (ZOLOFT) 100 MG tablet Take 1 tablet (100 mg total) by mouth daily. 90 tablet 3   No facility-administered medications prior to visit.     No Known Allergies  ROS As per HPI  PE: Blood pressure (!) 144/90, pulse 87, temperature 98.7 F (37.1 C), temperature source Oral, resp. rate 16, height 5\' 9"  (1.753 m), weight 180 lb (81.6 kg), SpO2 98 %. Gen: Alert, well appearing.  Patient is oriented to person, place, time, and situation. AFFECT: pleasant, lucid thought and speech. CV: RRR, no m/r/g.   LUNGS: CTA bilat, nonlabored resps, good aeration in all lung fields. Left side TTP just under the lateral aspect of lower rib--mild.  No ecchymoses.  LABS:  Lab Results  Component Value Date   CHOL 265 (H) 07/03/2015   HDL 59.30 07/03/2015   LDLDIRECT 104.0 07/03/2015   TRIG (H) 07/03/2015    801.0 Triglyceride is over 400; calculations on Lipids are invalid.   CHOLHDL 4 07/03/2015     Chemistry      Component Value Date/Time   NA 137 07/03/2015 0815   K 3.7 07/03/2015 0815   CL 99 07/03/2015 0815   CO2 26 07/03/2015 0815   BUN 12 07/03/2015 0815   CREATININE 0.84 07/03/2015  0815      Component Value Date/Time   CALCIUM 9.3 07/03/2015 0815   ALKPHOS 125 (H) 07/03/2015 0815   AST 98 (H) 07/03/2015 0815   ALT 76 (H) 07/03/2015 0815   BILITOT 0.9 07/03/2015 0815        IMPRESSION AND PLAN:  1) Abd wall contusion: resolving. Reviewed entire ED record from 10/02/15 from [place] center. Incidental liver hemangioma and simple renal cyst detected with follow up imaging---reassured pt, no monitoring/surveillance imaging is indicated.  2) Elevated bp w/out dx of HTN:  Instructions: Check your blood pressure and heart rate once daily--any time of day is fine.  Rest for 5 min in quiet place prior to checking it. Write down numbers for review with me at next follow up visit. Will check BMET at next f/u visit.  3)  Hypertriglyceridemia: needs recheck of lipid panel now that he is back on his tricor 145mg  qd. He is not fasting today, so we'll get this at next f/u in 1 mo.  4) Elevated transaminase: mild fatty infiltration on recent MRI.  Recheck CMET at f/u in 1 mo.  An After Visit Summary was printed and given to the patient.  Spent 25 min with pt today, with >50% of this time spent in counseling and care coordination regarding the above problems.  FOLLOW UP: Return in about 4 weeks (around 11/13/2015) for f/u elev bp and hypertrig.  Signed:  Crissie Sickles, MD           10/16/2015

## 2015-10-16 NOTE — Progress Notes (Signed)
Pre visit review using our clinic review tool, if applicable. No additional management support is needed unless otherwise documented below in the visit note. 

## 2015-10-16 NOTE — Patient Instructions (Signed)
Check your blood pressure and heart rate once daily--any time of day is fine.  Rest for 5 min in quiet place prior to checking it. Write down numbers for review with me at next follow up visit.

## 2015-12-08 DIAGNOSIS — M720 Palmar fascial fibromatosis [Dupuytren]: Secondary | ICD-10-CM | POA: Diagnosis not present

## 2016-02-08 ENCOUNTER — Ambulatory Visit (INDEPENDENT_AMBULATORY_CARE_PROVIDER_SITE_OTHER): Payer: BLUE CROSS/BLUE SHIELD | Admitting: Family Medicine

## 2016-02-08 ENCOUNTER — Encounter: Payer: Self-pay | Admitting: Family Medicine

## 2016-02-08 VITALS — BP 157/98 | HR 83 | Temp 99.2°F | Resp 16 | Ht 69.0 in | Wt 188.8 lb

## 2016-02-08 DIAGNOSIS — J040 Acute laryngitis: Secondary | ICD-10-CM

## 2016-02-08 DIAGNOSIS — J209 Acute bronchitis, unspecified: Secondary | ICD-10-CM | POA: Diagnosis not present

## 2016-02-08 NOTE — Progress Notes (Signed)
OFFICE VISIT  02/08/2016   CC:  Chief Complaint  Patient presents with  . Hoarse    x 2-3 days   HPI:    Patient is a 45 y.o.  male who presents for hoarseness.   Onset 3d/a.  Some cough/chest congestion came on at same time.  No nasal cong/runny nose.  Some ST.  No HA or body aches.  Ibuprofen has been tried. No fever.  A couple of recent sick contacts lately.   No wheezing or SOB.  Past Medical History:  Diagnosis Date  . Dyspepsia   . Elevated transaminase level 06/2015  . GAD (generalized anxiety disorder)    zoloft helpful, esp for recurrent epigastric pains  . 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  No f/u imaging needed.  . Hypertriglyceridemia   . 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 Recurrent oral ulcers   . 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 Ureterolithiasis 10/2012    Past Surgical History:  Procedure Laterality Date  . ESOPHAGOGASTRODUODENOSCOPY     dyspepsia/?PUD  . TOTAL HIP ARTHROPLASTY Right 02/23/2009   for avascular necrosis (Dr. Alvan Dame)    Outpatient Medications Prior to Visit  Medication Sig Dispense Refill  . fenofibrate (TRICOR) 145 MG tablet Take 1 tablet (145 mg total) by mouth daily. 30 tablet 6  . sertraline (ZOLOFT) 100 MG tablet Take 1 tablet (100 mg total) by mouth daily. 90 tablet 3   No facility-administered medications prior to visit.     No Known Allergies  ROS As per HPI  PE: Blood pressure (!) 157/98, pulse 83, temperature 99.2 F (37.3 C), temperature source Oral, resp. rate 16, height 5\' 9"  (1.753 m), weight 188 lb 12 oz (85.6 kg), SpO2 98 %. VS: noted--normal. Gen: alert, NAD, NONTOXIC APPEARING. HEENT: eyes without injection, drainage, or swelling.  Ears: EACs clear, TMs with normal light reflex and landmarks.  Nose: Clear rhinorrhea, with some dried, crusty exudate adherent to mildly injected mucosa.  No purulent d/c.  No paranasal sinus TTP.  No facial swelling.   Throat and mouth without focal lesion.  No pharyngial swelling, erythema, or exudate.   Neck: supple, no LAD.   LUNGS: CTA bilat, nonlabored resps.   CV: RRR, no m/r/g. EXT: no c/c/e SKIN: no rash  LABS:  none  IMPRESSION AND PLAN:  URI, with laryngitis and acute bronchitis. No sign of RAD or bacterial infection. Get otc generic robitussin DM OR Mucinex DM and use as directed on the packaging for cough and congestion. Use otc generic saline nasal spray 2-3 times per day to irrigate/moisturize your nasal passages. Recommended voice rest.  Pt declined flu vaccine today.  An After Visit Summary was printed and given to the patient.  FOLLOW UP: Return if symptoms worsen or fail to improve.  Signed:  Crissie Sickles, MD           02/08/2016

## 2016-02-08 NOTE — Progress Notes (Signed)
Pre visit review using our clinic review tool, if applicable. No additional management support is needed unless otherwise documented below in the visit note. 

## 2016-02-08 NOTE — Patient Instructions (Signed)
Get otc generic robitussin DM OR Mucinex DM and use as directed on the packaging for cough and congestion. Use otc generic saline nasal spray 2-3 times per day to irrigate/moisturize your nasal passages.   

## 2016-05-11 ENCOUNTER — Other Ambulatory Visit: Payer: Self-pay | Admitting: Family Medicine

## 2016-07-01 ENCOUNTER — Encounter: Payer: BLUE CROSS/BLUE SHIELD | Admitting: Family Medicine

## 2016-07-23 ENCOUNTER — Other Ambulatory Visit: Payer: Self-pay | Admitting: Family Medicine

## 2016-07-24 NOTE — Telephone Encounter (Signed)
[facility]   RF request for sertraline LOV: 10/16/15 Next ov: None Last written: 06/30/15 #90 w/ 3Rf

## 2016-08-30 ENCOUNTER — Other Ambulatory Visit: Payer: Self-pay | Admitting: Family Medicine

## 2016-08-30 NOTE — Telephone Encounter (Signed)
[facility]  RF request for fenofibrate LOV: 10/16/15 Next ov: None Last written: 05/13/16 #30 w/ 2RF  Will send rx for #30 w/ 0Rf. Pt needs office visit for more refills.

## 2016-08-30 NOTE — Telephone Encounter (Signed)
Left message for pt to call back  °

## 2016-09-02 NOTE — Telephone Encounter (Signed)
Pt advised and voiced understanding, apt made for 09/06/16 at 9:30am pt advised to come fasting.

## 2016-09-05 ENCOUNTER — Encounter: Payer: Self-pay | Admitting: *Deleted

## 2016-09-05 DIAGNOSIS — E781 Pure hyperglyceridemia: Secondary | ICD-10-CM | POA: Insufficient documentation

## 2016-09-06 ENCOUNTER — Ambulatory Visit (INDEPENDENT_AMBULATORY_CARE_PROVIDER_SITE_OTHER): Payer: BLUE CROSS/BLUE SHIELD | Admitting: Family Medicine

## 2016-09-06 ENCOUNTER — Encounter: Payer: Self-pay | Admitting: Family Medicine

## 2016-09-06 VITALS — BP 134/72 | HR 107 | Temp 98.9°F | Resp 16 | Ht 69.0 in | Wt 185.8 lb

## 2016-09-06 DIAGNOSIS — F330 Major depressive disorder, recurrent, mild: Secondary | ICD-10-CM | POA: Diagnosis not present

## 2016-09-06 DIAGNOSIS — E781 Pure hyperglyceridemia: Secondary | ICD-10-CM

## 2016-09-06 DIAGNOSIS — R740 Nonspecific elevation of levels of transaminase and lactic acid dehydrogenase [LDH]: Secondary | ICD-10-CM

## 2016-09-06 DIAGNOSIS — E039 Hypothyroidism, unspecified: Secondary | ICD-10-CM

## 2016-09-06 DIAGNOSIS — R7401 Elevation of levels of liver transaminase levels: Secondary | ICD-10-CM

## 2016-09-06 DIAGNOSIS — E038 Other specified hypothyroidism: Secondary | ICD-10-CM

## 2016-09-06 DIAGNOSIS — F411 Generalized anxiety disorder: Secondary | ICD-10-CM | POA: Diagnosis not present

## 2016-09-06 LAB — COMPREHENSIVE METABOLIC PANEL
ALBUMIN: 3.8 g/dL (ref 3.5–5.2)
ALK PHOS: 78 U/L (ref 39–117)
ALT: 38 U/L (ref 0–53)
AST: 51 U/L — ABNORMAL HIGH (ref 0–37)
BILIRUBIN TOTAL: 0.6 mg/dL (ref 0.2–1.2)
BUN: 9 mg/dL (ref 6–23)
CALCIUM: 9.1 mg/dL (ref 8.4–10.5)
CO2: 26 mEq/L (ref 19–32)
Chloride: 103 mEq/L (ref 96–112)
Creatinine, Ser: 0.85 mg/dL (ref 0.40–1.50)
GFR: 102.51 mL/min (ref >60.00)
Glucose, Bld: 121 mg/dL — ABNORMAL HIGH (ref 70–99)
Potassium: 4 mEq/L (ref 3.5–5.1)
Sodium: 140 mEq/L (ref 135–145)
TOTAL PROTEIN: 6.6 g/dL (ref 6.0–8.3)

## 2016-09-06 LAB — LIPID PANEL
CHOLESTEROL: 229 mg/dL — AB (ref 0–200)
HDL: 67.1 mg/dL (ref >39.00)
NonHDL: 161.91
TRIGLYCERIDES: 377 mg/dL — AB (ref 0.0–149.0)
Total CHOL/HDL Ratio: 3
VLDL: 75.4 mg/dL — ABNORMAL HIGH (ref 0.0–40.0)

## 2016-09-06 LAB — T4, FREE: Free T4: 0.64 ng/dL (ref 0.60–1.60)

## 2016-09-06 LAB — LDL CHOLESTEROL, DIRECT: LDL DIRECT: 114 mg/dL

## 2016-09-06 LAB — TSH: TSH: 5.49 u[IU]/mL — AB (ref 0.35–4.50)

## 2016-09-06 MED ORDER — SERTRALINE HCL 100 MG PO TABS: 100.0000 mg | ORAL_TABLET | Freq: Every day | ORAL | 5 refills | Status: DC

## 2016-09-06 NOTE — Progress Notes (Signed)
OFFICE VISIT  09/06/2016   CC:  Chief Complaint  Patient presents with  . Follow-up    RCI, pt is fasting.   HPI:    Patient is a 46 y.o. Caucasian male who presents for f/u hypertriglyceridemia, anxiety, elev bp w/out dx of HTN, and subclinical hypothyroidism.   Trig: compliant with fenofibrate w/out side effect. Cut out sodas/concentrated sweets lately, drinking more water. No exercise.  HTN: he did not monitor any bp's outside of the office like he had planned to.  Anxiety: higher lately.  Worries about everything, lots going on at work, trouble concentrating, feeling depressed mood.  NO SI or HI. Says he does feel anxious/nervous here today in office.  Declines counseling b/c no time.    Past Medical History:  Diagnosis Date  . Dyspepsia   . Elevated transaminase level 06/2015  . GAD (generalized anxiety disorder)    zoloft helpful, esp for recurrent epigastric pains  . 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  No f/u imaging needed.  . Hepatic steatosis   . Hypertriglyceridemia   . 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 Recurrent oral ulcers   . 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 Ureterolithiasis 10/2012    Past Surgical History:  Procedure Laterality Date  . ESOPHAGOGASTRODUODENOSCOPY     dyspepsia/?PUD  . TOTAL HIP ARTHROPLASTY Right 02/23/2009   for avascular necrosis (Dr. Alvan Dame)    Outpatient Medications Prior to Visit  Medication Sig Dispense Refill  . fenofibrate (TRICOR) 145 MG tablet Take 1 tablet (145 mg total) by mouth daily. 30 tablet 0  . sertraline (ZOLOFT) 100 MG tablet Take 1 tablet (100 mg total) by mouth daily. 90 tablet 1   No facility-administered medications prior to visit.     No Known Allergies  ROS As per HPI  PE: Blood pressure 134/72, pulse (!) 107, temperature 98.9 F (37.2 C), temperature source Oral, resp. rate 16, height 5\' 9"  (1.753 m), weight 185 lb 12 oz (84.3 kg), SpO2  96 %.  Repeat bp 134/72 today. Gen: Alert, well appearing.  Patient is oriented to person, place, time, and situation. AFFECT: pleasant, lucid thought and speech. CV: RRR, no m/r/g.   LUNGS: CTA bilat, nonlabored resps, good aeration in all lung fields.   LABS:  Lab Results  Component Value Date   TSH 8.05 (H) 07/03/2015   Lab Results  Component Value Date   WBC 6.3 07/03/2015   HGB 14.9 07/03/2015   HCT 43.5 07/03/2015   MCV 98.3 07/03/2015   PLT 275.0 07/03/2015   Lab Results  Component Value Date   CREATININE 0.84 07/03/2015   BUN 12 07/03/2015   NA 137 07/03/2015   K 3.7 07/03/2015   CL 99 07/03/2015   CO2 26 07/03/2015   Lab Results  Component Value Date   ALT 76 (H) 07/03/2015   AST 98 (H) 07/03/2015   ALKPHOS 125 (H) 07/03/2015   BILITOT 0.9 07/03/2015   Lab Results  Component Value Date   CHOL 265 (H) 07/03/2015   Lab Results  Component Value Date   HDL 59.30 07/03/2015   No results found for: [facility] Lab Results  Component Value Date   TRIG (H) 07/03/2015    801.0 Triglyceride is over 400; calculations on Lipids are invalid.   Lab Results  Component Value Date   CHOLHDL 4 07/03/2015    IMPRESSION AND PLAN:  1) Hypertriglyceridemia: tolerating fenofibrate.  Check FLP today.  2) Hx of elevated bp w/out dx HTN: bp today in office avg 138/78.  Once again, encouraged pt to check bp out of office a few times between now and next o/v in 6 weeks.  3) Subclinical hypothyroidism: recheck TSH, T3, and T4 today.  4) GAD and depression (mild/recurrent/no psychotic features): Increase sertraline to 150mg  qd x 10-14d, then start sertraline 200 mg qd. He declined trial of prn benzo. He is too busy to try counseling.  An After Visit Summary was printed and given to the patient.  FOLLOW UP: Return in about 6 weeks (around 10/18/2016) for f/u anxiety/mood.  Signed:  Crissie Sickles, MD           09/06/2016

## 2016-09-06 NOTE — Patient Instructions (Signed)
Take 1 and 1/2 of your 100 mg sertraline every day for 10-14d, then start taking 2 of the 100mg  sertraline tabs every day.

## 2016-09-07 LAB — T3: T3, Total: 107 ng/dL (ref 76–181)

## 2016-09-08 ENCOUNTER — Encounter: Payer: Self-pay | Admitting: Family Medicine

## 2016-09-09 ENCOUNTER — Other Ambulatory Visit (INDEPENDENT_AMBULATORY_CARE_PROVIDER_SITE_OTHER): Payer: BLUE CROSS/BLUE SHIELD

## 2016-09-09 ENCOUNTER — Encounter: Payer: Self-pay | Admitting: Family Medicine

## 2016-09-09 DIAGNOSIS — R7301 Impaired fasting glucose: Secondary | ICD-10-CM

## 2016-09-09 LAB — HEMOGLOBIN A1C: Hgb A1c MFr Bld: 5.6 % (ref 4.6–6.5)

## 2016-09-19 ENCOUNTER — Telehealth: Payer: Self-pay | Admitting: Family Medicine

## 2016-09-19 ENCOUNTER — Other Ambulatory Visit: Payer: Self-pay | Admitting: *Deleted

## 2016-09-19 DIAGNOSIS — R7301 Impaired fasting glucose: Secondary | ICD-10-CM

## 2016-09-19 NOTE — Telephone Encounter (Signed)
Left message for pt to call back. Please see lab results.

## 2016-09-19 NOTE — Telephone Encounter (Signed)
Patient is returning Heather's call regarding lab results.  I advised patient Heatther is currently at lunch, will send a message to have her return call.

## 2016-10-09 ENCOUNTER — Telehealth: Payer: Self-pay | Admitting: Family Medicine

## 2016-10-09 MED ORDER — SERTRALINE HCL 100 MG PO TABS: ORAL_TABLET | ORAL | 5 refills | Status: AC

## 2016-10-09 NOTE — Telephone Encounter (Signed)
Dr. Anitra Lauth, I'm not sure how you wanted to send his Rx for zoloft. I see that one was sent on 09/06/16 but it was for 100mg  tab 1 tab qd. But in your office note it looks like you wanted to increase this. Please advise. Thanks.

## 2016-10-09 NOTE — Telephone Encounter (Signed)
My fault. I did rx for #60 of the 100 mg zoloft last rx, but the sig still said take 1 qd. I eRx'd corrected rx today.

## 2016-10-09 NOTE — Telephone Encounter (Signed)
Patient states that he was supposed to get RX for zoloft 200mg  daily sent to [facility].   Please advise.

## 2016-10-09 NOTE — Telephone Encounter (Signed)
Pt advised and voiced understanding.   

## 2016-11-11 ENCOUNTER — Other Ambulatory Visit: Payer: Self-pay | Admitting: Family Medicine

## 2017-12-31 IMAGING — MR MR ABDOMEN WO/W CM
7 of 16 series · 20 of 48 positions shown · IV contrast (multihance)
Comparison: Ultrasound of the abdomen of 10/09/2015. CT of
11/21/2012.

CLINICAL DATA: Renal and liver lesions on ultrasound.

EXAM:
MRI ABDOMEN WITHOUT AND WITH CONTRAST
TECHNIQUE: Multiplanar multisequence MR imaging of the abdomen was performed
both before and after the administration of intravenous contrast.
CONTRAST:  17mL MULTIHANCE GADOBENATE DIMEGLUMINE 529 MG/ML IV SOLN

[Series 3: T2 · coronal · 7.0mm · 1.56mm/px · 3 of 34 slices shown (1 of 2)]
[im 1/34]
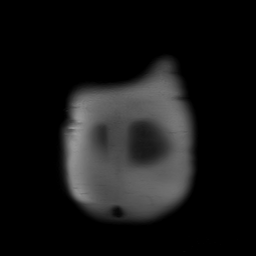
[im 17/34]
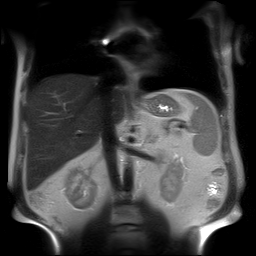
[im 34/34]
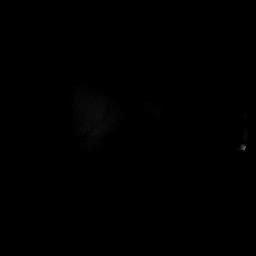

[Series 4: T2 fat-sat · axial · 6.0mm · 1.48mm/px · z∈[-94,+124]mm · 2 of 30 slices shown]
[im 1/30]
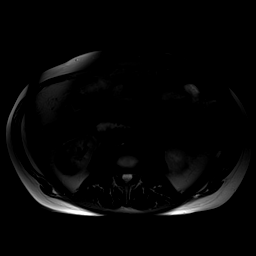
[im 30/30]
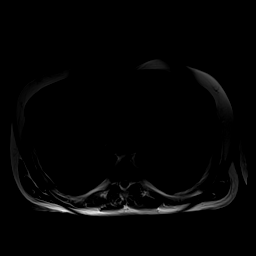

[Series 5: acr-in + out · axial · 6.0mm · 0.74mm/px · z∈[-155,+92]mm · 4 of 68 slices shown]
[im 1/68]
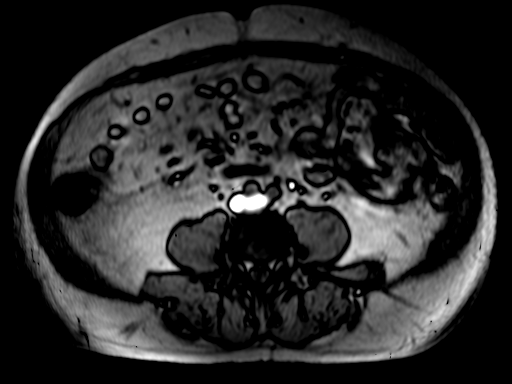
[im 23/68]
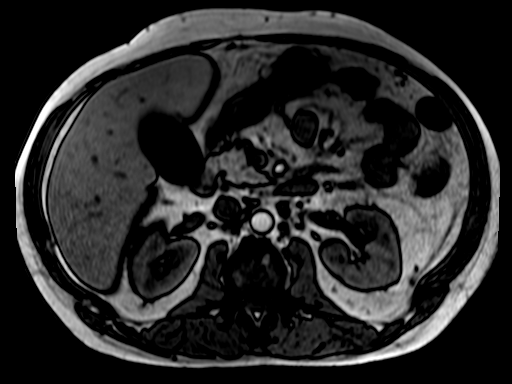
[im 45/68]
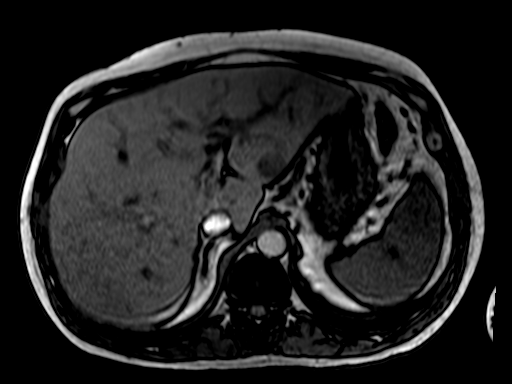
[im 68/68]
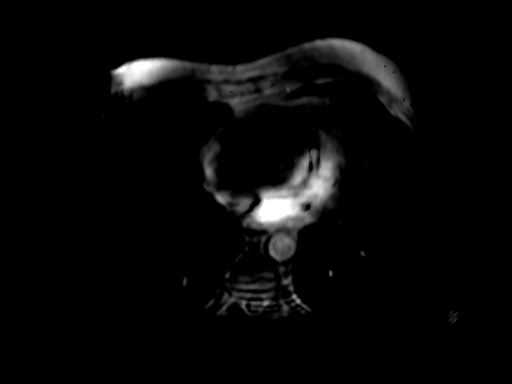

[Series 6: T2 · axial · 6.0mm · 1.48mm/px · z∈[-155,+92]mm · 2 of 34 slices shown (2 of 2)]
[im 1/34]
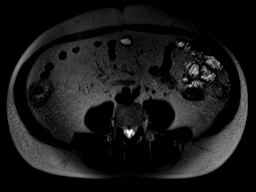
[im 34/34]
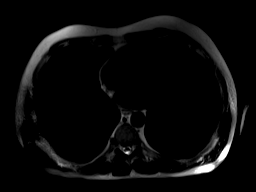

[Series 7: [id number]_[id number] free breathing · axial · 6.0mm · 1.98mm/px · z∈[-94,+124]mm · 5 of 90 slices shown]
[im 1/90]
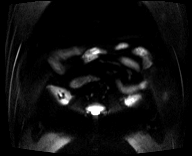
[im 23/90]
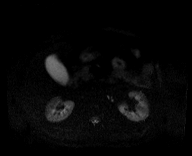
[im 45/90]
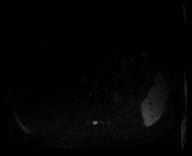
[im 67/90]
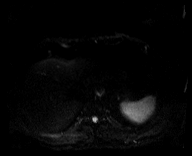
[im 90/90]
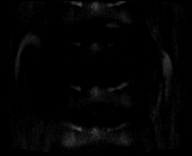

[Series 8: [id number] free breathing_adc · axial · 6.0mm · 1.98mm/px · z∈[-94,+124]mm · 2 of 30 slices shown]
[im 1/30]
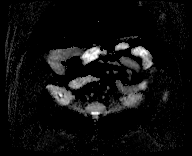
[im 30/30]
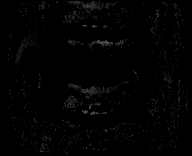

[Series 9: DWI · axial · 6.0mm · 0.74mm/px · z∈[-170,+107]mm · 2 of 38 slices shown]
[im 1/38]
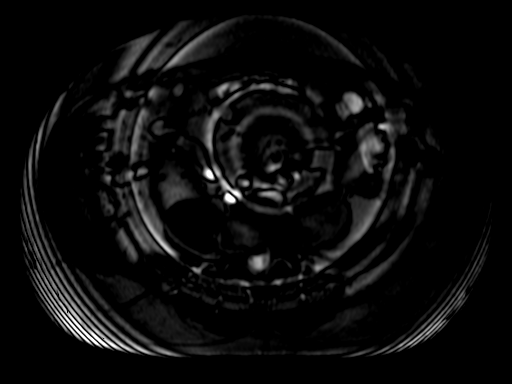
[im 38/38]
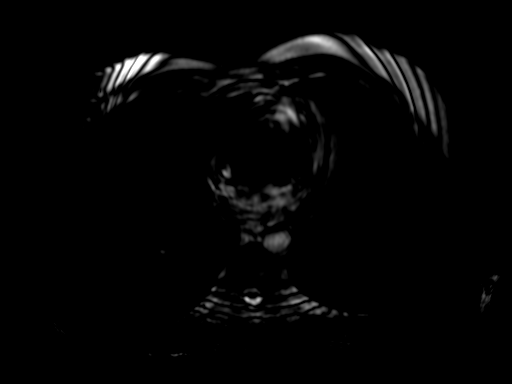

[20 of 48 positions shown; findings below may reference images not displayed]

FINDINGS: Lower chest: Normal heart size without pericardial or pleural
effusion.

Hepatobiliary: High right hepatic lobe 10 mm T2 hyperintense lesion
image 6/series 4 is hypo enhancing on early post-contrast images but
may have minimal nodular enhancement on delayed image 16/ series 9.

The other ultrasound abnormality, adjacent the portal vein,
represents focal steatosis, including on image 13/series 5.

Normal gallbladder, without biliary ductal dilatation.

Pancreas: Normal, without mass or ductal dilatation.

Spleen: Normal in size, without focal abnormality.

Adrenals/Urinary Tract: Normal adrenal glands. Normal right kidney.
An interpolar left renal 9 mm T1 hyperintense lesion on image
43/series 11 demonstrates no post-contrast enhancement.

No hydronephrosis.

Stomach/Bowel: Normal stomach and abdominal bowel loops.

Vascular/Lymphatic: Normal caliber of the aorta and branch vessels.
No retroperitoneal or retrocrural adenopathy.

Other: No ascites.

Musculoskeletal: No acute osseous abnormality.
IMPRESSION: 1. Left renal 9 mm hemorrhagic cyst.
2. High right hepatic lobe lesion which is likely a hemangioma,
given T2 hyperintensity and suggestion of delayed hyper enhancement.
A minimally complex cyst is felt less likely. No suspicious liver
lesion.
